# Patient Record
Sex: Male | Born: 1965 | Race: White | Hispanic: No | Marital: Single | Smoking: Current some day smoker
Health system: Southern US, Community
[De-identification: ages and names within clinical notes are randomized; demographics above are authoritative.]

## PROBLEM LIST (undated history)

## (undated) DIAGNOSIS — M654 Radial styloid tenosynovitis [de Quervain]: Secondary | ICD-10-CM

## (undated) DIAGNOSIS — I10 Essential (primary) hypertension: Secondary | ICD-10-CM

## (undated) DIAGNOSIS — K219 Gastro-esophageal reflux disease without esophagitis: Secondary | ICD-10-CM

## (undated) DIAGNOSIS — E1165 Type 2 diabetes mellitus with hyperglycemia: Secondary | ICD-10-CM

## (undated) DIAGNOSIS — S069X9A Unspecified intracranial injury with loss of consciousness of unspecified duration, initial encounter: Secondary | ICD-10-CM

## (undated) DIAGNOSIS — R05 Cough: Secondary | ICD-10-CM

## (undated) DIAGNOSIS — M5430 Sciatica, unspecified side: Secondary | ICD-10-CM

## (undated) DIAGNOSIS — F1910 Other psychoactive substance abuse, uncomplicated: Secondary | ICD-10-CM

## (undated) DIAGNOSIS — E1140 Type 2 diabetes mellitus with diabetic neuropathy, unspecified: Secondary | ICD-10-CM

## (undated) DIAGNOSIS — F1290 Cannabis use, unspecified, uncomplicated: Secondary | ICD-10-CM

## (undated) DIAGNOSIS — F1020 Alcohol dependence, uncomplicated: Secondary | ICD-10-CM

## (undated) DIAGNOSIS — E782 Mixed hyperlipidemia: Secondary | ICD-10-CM

## (undated) HISTORY — DX: Gastro-esophageal reflux disease without esophagitis: K21.9

## (undated) HISTORY — DX: Cannabis use, unspecified, uncomplicated: F12.90

## (undated) HISTORY — DX: Cough: R05

## (undated) HISTORY — DX: Type 2 diabetes mellitus with hyperglycemia: E11.65

## (undated) HISTORY — DX: Mixed hyperlipidemia: E78.2

## (undated) HISTORY — DX: Type 2 diabetes mellitus with diabetic neuropathy, unspecified: E11.40

## (undated) HISTORY — DX: Other psychoactive substance abuse, uncomplicated: F19.10

## (undated) HISTORY — DX: Alcohol dependence, uncomplicated: F10.20

## (undated) HISTORY — PX: OTHER SURGICAL HISTORY: SHX169

## (undated) HISTORY — DX: Radial styloid tenosynovitis (de quervain): M65.4

## (undated) HISTORY — DX: Sciatica, unspecified side: M54.30

## (undated) HISTORY — DX: Unspecified intracranial injury with loss of consciousness of unspecified duration, initial encounter: S06.9X9A

## (undated) HISTORY — DX: Essential (primary) hypertension: I10

---

## 2004-08-02 ENCOUNTER — Ambulatory Visit: Payer: Self-pay | Admitting: Family Medicine

## 2011-06-06 ENCOUNTER — Emergency Department: Payer: Self-pay | Admitting: Emergency Medicine

## 2012-06-18 DIAGNOSIS — E782 Mixed hyperlipidemia: Secondary | ICD-10-CM

## 2012-06-18 DIAGNOSIS — M5430 Sciatica, unspecified side: Secondary | ICD-10-CM

## 2012-06-18 DIAGNOSIS — I10 Essential (primary) hypertension: Secondary | ICD-10-CM

## 2012-06-18 DIAGNOSIS — F1910 Other psychoactive substance abuse, uncomplicated: Secondary | ICD-10-CM

## 2012-06-18 DIAGNOSIS — M654 Radial styloid tenosynovitis [de Quervain]: Secondary | ICD-10-CM

## 2012-06-18 DIAGNOSIS — E1140 Type 2 diabetes mellitus with diabetic neuropathy, unspecified: Secondary | ICD-10-CM | POA: Insufficient documentation

## 2012-06-18 DIAGNOSIS — K219 Gastro-esophageal reflux disease without esophagitis: Secondary | ICD-10-CM | POA: Insufficient documentation

## 2012-06-18 DIAGNOSIS — S069X9A Unspecified intracranial injury with loss of consciousness of unspecified duration, initial encounter: Secondary | ICD-10-CM

## 2012-06-18 DIAGNOSIS — F1020 Alcohol dependence, uncomplicated: Secondary | ICD-10-CM | POA: Insufficient documentation

## 2012-06-18 DIAGNOSIS — S069XAA Unspecified intracranial injury with loss of consciousness status unknown, initial encounter: Secondary | ICD-10-CM

## 2012-06-18 HISTORY — DX: Other psychoactive substance abuse, uncomplicated: F19.10

## 2012-06-18 HISTORY — DX: Mixed hyperlipidemia: E78.2

## 2012-06-18 HISTORY — DX: Radial styloid tenosynovitis (de quervain): M65.4

## 2012-06-18 HISTORY — DX: Unspecified intracranial injury with loss of consciousness status unknown, initial encounter: S06.9XAA

## 2012-06-18 HISTORY — DX: Unspecified intracranial injury with loss of consciousness of unspecified duration, initial encounter: S06.9X9A

## 2012-06-18 HISTORY — DX: Essential (primary) hypertension: I10

## 2012-06-18 HISTORY — DX: Sciatica, unspecified side: M54.30

## 2012-08-18 ENCOUNTER — Ambulatory Visit: Payer: Self-pay | Admitting: Neurology

## 2013-11-04 DIAGNOSIS — E1165 Type 2 diabetes mellitus with hyperglycemia: Secondary | ICD-10-CM

## 2013-11-04 DIAGNOSIS — IMO0002 Reserved for concepts with insufficient information to code with codable children: Secondary | ICD-10-CM

## 2013-11-04 DIAGNOSIS — E1140 Type 2 diabetes mellitus with diabetic neuropathy, unspecified: Secondary | ICD-10-CM

## 2013-11-04 HISTORY — DX: Reserved for concepts with insufficient information to code with codable children: IMO0002

## 2013-11-04 HISTORY — DX: Type 2 diabetes mellitus with diabetic neuropathy, unspecified: E11.40

## 2014-01-11 ENCOUNTER — Emergency Department: Payer: Self-pay | Admitting: Emergency Medicine

## 2014-01-11 LAB — CBC
HCT: 43.8 % (ref 40.0–52.0)
HGB: 14.6 g/dL (ref 13.0–18.0)
MCH: 29.4 pg (ref 26.0–34.0)
MCHC: 33.3 g/dL (ref 32.0–36.0)
MCV: 88 fL (ref 80–100)
Platelet: 245 10*3/uL (ref 150–440)
RBC: 4.96 10*6/uL (ref 4.40–5.90)
RDW: 12.7 % (ref 11.5–14.5)
WBC: 7.5 10*3/uL (ref 3.8–10.6)

## 2014-01-11 LAB — BASIC METABOLIC PANEL
Anion Gap: 8 (ref 7–16)
BUN: 14 mg/dL (ref 7–18)
CO2: 30 mmol/L (ref 21–32)
Calcium, Total: 9.3 mg/dL (ref 8.5–10.1)
Chloride: 97 mmol/L — ABNORMAL LOW (ref 98–107)
Creatinine: 0.99 mg/dL (ref 0.60–1.30)
EGFR (African American): >60
EGFR (Non-African Amer.): >60
GLUCOSE: 374 mg/dL — AB (ref 65–99)
Osmolality: 286 (ref 275–301)
POTASSIUM: 3.9 mmol/L (ref 3.5–5.1)
Sodium: 135 mmol/L — ABNORMAL LOW (ref 136–145)

## 2014-01-11 LAB — PROTIME-INR
INR: 0.8
PROTHROMBIN TIME: 11.4 s — AB (ref 11.5–14.7)

## 2014-01-11 LAB — TROPONIN I

## 2014-07-02 ENCOUNTER — Emergency Department: Admission: EM | Admit: 2014-07-02 | Discharge: 2014-07-02

## 2014-07-02 NOTE — ED Notes (Signed)
Forensic blood draw obtained per [employer]. Pt cursing and yelling during procedure. Specimens obtained from right hand, cleaned with betadine per protocol. Given to officer. Pt taken via [employer].

## 2015-05-18 DIAGNOSIS — E119 Type 2 diabetes mellitus without complications: Secondary | ICD-10-CM | POA: Insufficient documentation

## 2015-05-18 DIAGNOSIS — Z720 Tobacco use: Secondary | ICD-10-CM | POA: Insufficient documentation

## 2015-05-18 DIAGNOSIS — Z794 Long term (current) use of insulin: Secondary | ICD-10-CM

## 2015-05-18 DIAGNOSIS — F1020 Alcohol dependence, uncomplicated: Secondary | ICD-10-CM

## 2015-05-18 HISTORY — DX: Alcohol dependence, uncomplicated: F10.20

## 2016-03-04 DIAGNOSIS — F1290 Cannabis use, unspecified, uncomplicated: Secondary | ICD-10-CM | POA: Insufficient documentation

## 2016-03-15 DIAGNOSIS — K219 Gastro-esophageal reflux disease without esophagitis: Secondary | ICD-10-CM

## 2016-03-15 DIAGNOSIS — R053 Chronic cough: Secondary | ICD-10-CM

## 2016-03-15 DIAGNOSIS — R05 Cough: Secondary | ICD-10-CM | POA: Insufficient documentation

## 2016-03-15 DIAGNOSIS — F1290 Cannabis use, unspecified, uncomplicated: Secondary | ICD-10-CM

## 2016-03-15 HISTORY — DX: Gastro-esophageal reflux disease without esophagitis: K21.9

## 2016-03-15 HISTORY — DX: Cannabis use, unspecified, uncomplicated: F12.90

## 2016-03-15 HISTORY — DX: Chronic cough: R05.3

## 2016-03-18 ENCOUNTER — Other Ambulatory Visit: Payer: Self-pay | Admitting: Internal Medicine

## 2016-03-18 DIAGNOSIS — F1290 Cannabis use, unspecified, uncomplicated: Secondary | ICD-10-CM

## 2016-03-18 DIAGNOSIS — R05 Cough: Secondary | ICD-10-CM

## 2016-03-18 DIAGNOSIS — R059 Cough, unspecified: Secondary | ICD-10-CM

## 2016-03-26 ENCOUNTER — Ambulatory Visit
Admission: RE | Admit: 2016-03-26 | Discharge: 2016-03-26 | Disposition: A | Discharge status: Home / Self Care | Payer: BLUE CROSS/BLUE SHIELD | Source: Ambulatory Visit | Attending: Internal Medicine | Admitting: Internal Medicine

## 2016-03-26 DIAGNOSIS — R05 Cough: Secondary | ICD-10-CM | POA: Diagnosis present

## 2016-03-26 DIAGNOSIS — R059 Cough, unspecified: Secondary | ICD-10-CM

## 2016-03-26 DIAGNOSIS — R1901 Right upper quadrant abdominal swelling, mass and lump: Secondary | ICD-10-CM | POA: Diagnosis not present

## 2016-03-26 DIAGNOSIS — F1290 Cannabis use, unspecified, uncomplicated: Secondary | ICD-10-CM

## 2016-03-26 DIAGNOSIS — I2510 Atherosclerotic heart disease of native coronary artery without angina pectoris: Secondary | ICD-10-CM | POA: Insufficient documentation

## 2016-04-01 ENCOUNTER — Other Ambulatory Visit: Payer: Self-pay | Admitting: Internal Medicine

## 2016-04-01 DIAGNOSIS — IMO0002 Reserved for concepts with insufficient information to code with codable children: Secondary | ICD-10-CM

## 2016-04-04 ENCOUNTER — Ambulatory Visit
Admission: RE | Admit: 2016-04-04 | Discharge: 2016-04-04 | Disposition: A | Discharge status: Home / Self Care | Payer: BLUE CROSS/BLUE SHIELD | Source: Ambulatory Visit | Attending: Internal Medicine | Admitting: Internal Medicine

## 2016-04-04 DIAGNOSIS — K760 Fatty (change of) liver, not elsewhere classified: Secondary | ICD-10-CM | POA: Diagnosis not present

## 2016-04-04 DIAGNOSIS — IMO0002 Reserved for concepts with insufficient information to code with codable children: Secondary | ICD-10-CM

## 2016-04-04 DIAGNOSIS — K668 Other specified disorders of peritoneum: Secondary | ICD-10-CM | POA: Diagnosis present

## 2016-04-04 DIAGNOSIS — N281 Cyst of kidney, acquired: Secondary | ICD-10-CM | POA: Diagnosis not present

## 2016-04-17 ENCOUNTER — Ambulatory Visit: Payer: BLUE CROSS/BLUE SHIELD | Admitting: Urology

## 2016-04-17 ENCOUNTER — Encounter: Payer: Self-pay | Admitting: Urology

## 2016-04-17 VITALS — BP 161/90 | HR 74 | Ht 69.0 in | Wt 195.0 lb

## 2016-04-17 DIAGNOSIS — N281 Cyst of kidney, acquired: Secondary | ICD-10-CM

## 2016-04-17 NOTE — Progress Notes (Signed)
04/17/2016 3:56 PM   Frederick Koyanagiurtis Heath Mcaleer Jr. October 06, 1965 [id number]  Referring provider: Leotis ShamesJasmine Singh, MD [address] [facility] [place], [place] [id number]  Chief Complaint  Patient presents with  . New Patient (Initial Visit)    Renal Cyst    HPI: 51 year old male who presents today for further evaluation of a 6 centimeter right upper pole renal cyst. He underwent workup for chronic cough and right shoulder pain by his PCP which included a chest x-ray, CT scan at which time an incidental renal) was identified. He underwent further imaging in the form of CT abdomen without contrast which revealed a 6.2 cm right upper pole simple renal cyst.  He reports that he does have occasional back pain, both right and left but greater on the right. He attributed this to sciatica and musculoskeletal pain. This is not severe.  He does also report that over the past 6+ months, he has had a chronic cough and right shoulder pain. She underwent further workup including CT of the chest which showed no lung pathology. Etiology of the cough is unclear. He states that this happened soon [place]. His mother also developed the same chronic cough.  His past medical history is significant for alcohol dependence, drinks at least 6 beers daily and uses marijuana daily.   He denies any voiding issues. No gross hematuria or dysuria.  No previous renal imaging for comparison.  PMH: Past Medical History:  Diagnosis Date  . Alcohol dependence, daily use (HCC) 05/18/2015  . Chronic cough 03/15/2016  . De Quervain's tenosynovitis 06/18/2012  . Essential hypertension 06/18/2012  . GERD without esophagitis 03/15/2016  . Marijuana smoker, continuous (HCC) 03/15/2016  . Mixed hyperlipidemia 06/18/2012  . Polysubstance abuse 06/18/2012  . Sciatica 06/18/2012  . 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 Uncontrolled type 2 diabetes with neuropathy (HCC) 11/04/2013    Surgical History: Past Surgical History:  Procedure Laterality Date  . none      Home Medications:  Allergies as of 04/17/2016   No Known Allergies     Medication List       Accurate as of 04/17/16 11:59 PM. Always use your most recent med list.          fluticasone 110 MCG/ACT inhaler Commonly known as:  FLOVENT HFA Inhale into the lungs.   insulin regular 250 units/2.785mL (100 units/mL) injection Commonly known as:  NOVOLIN R,HUMULIN R Take 15 units tid ac   levocetirizine 5 MG tablet Commonly known as:  XYZAL Take by mouth.   lisinopril-hydrochlorothiazide 10-12.5 MG tablet Commonly known as:  PRINZIDE,ZESTORETIC Take by mouth.   montelukast 10 MG tablet Commonly known as:  SINGULAIR Take by mouth.       Allergies: No Known Allergies  Family History: Family History  Problem Relation Age of Onset  . Bladder Cancer Neg Hx   . Prostate cancer Neg Hx     Social History:  reports that he has never smoked. His smokeless tobacco use includes Snuff. He reports that he drinks alcohol. He reports that he uses drugs, including Marijuana.  ROS: UROLOGY Frequent Urination?: Yes Hard to postpone urination?: No Burning/pain with urination?: No Get up at night to urinate?: Yes Leakage of urine?: No Urine stream starts and stops?: No Trouble starting stream?: No Do you have to strain to urinate?: No Blood  in urine?: No Urinary tract infection?: No Sexually transmitted disease?: Yes Injury to kidneys or bladder?: Yes Painful intercourse?: No Weak stream?: No Erection problems?: No Penile pain?: No  Gastrointestinal Nausea?: No Vomiting?: No Indigestion/heartburn?: No Diarrhea?: No Constipation?: No  Constitutional Fever: No Night sweats?: No Weight loss?: No Fatigue?: No  Skin Skin rash/lesions?: No Itching?: No  Eyes Blurred vision?: No Double vision?:  No  Ears/Nose/Throat Sore throat?: No Sinus problems?: Yes  Hematologic/Lymphatic Swollen glands?: No Easy bruising?: No  Cardiovascular Leg swelling?: No Chest pain?: No  Respiratory Cough?: Yes Shortness of breath?: No  Endocrine Excessive thirst?: No  Musculoskeletal Back pain?: No Joint pain?: No  Neurological Headaches?: No Dizziness?: No  Psychologic Depression?: No Anxiety?: No  Physical Exam: BP (!) 161/90   Pulse 74   Ht 5\' 9"  (1.753 m)   Wt 195 lb (88.5 kg)   BMI 28.80 kg/m   Constitutional:  Alert and oriented, No acute distress. HEENT: [place] AT, moist mucus membranes.  Trachea midline, no masses. Cardiovascular: No clubbing, cyanosis, or edema. Respiratory: Normal respiratory effort, no increased work of breathing. GI: Abdomen is soft, nontender, nondistended, no abdominal masses GU: No CVA tenderness.  Skin: No rashes, bruises or suspicious lesions. Neurologic: Grossly intact, no focal deficits, moving all 4 extremities. Psychiatric: Normal mood and affect.  Laboratory Data: Cr 0.8 on 06/15/16 A1C 11.5 on 06/16/15  Urinalysis n/a  Pertinent Imaging: CLINICAL DATA:  Abdominal cyst.  EXAM: CT ABDOMEN WITHOUT CONTRAST  TECHNIQUE: Multidetector CT imaging of the abdomen was performed following the standard protocol without IV contrast.  COMPARISON:  None.  FINDINGS: Lower chest: No acute abnormality.  Hepatobiliary: No gallstones are noted. Fatty infiltration of the liver is noted.  Pancreas: Unremarkable. No pancreatic ductal dilatation or surrounding inflammatory changes.  Spleen: Normal in size without focal abnormality.  Adrenals/Urinary Tract: Adrenal glands appear normal. No hydronephrosis or renal obstruction is noted. 6.2 cm simple cyst is seen arising from upper pole of right kidney.  Stomach/Bowel: Stomach is within normal limits. Appendix appears normal. No evidence of bowel wall thickening, distention,  or inflammatory changes.  Vascular/Lymphatic: No significant vascular findings are present. No enlarged abdominal or pelvic lymph nodes.  Other: No abdominal wall hernia or abnormality.  Musculoskeletal: No acute or significant osseous findings.  IMPRESSION: Fatty infiltration of the liver.  6.2 cm simple right renal cyst.   Electronically Signed   By: Lupita Raider, M.D.   On: 04/04/2016 14:34  ET imaging was reviewed today personally and with the patient.   Assessment & Plan:     1. Renal cyst, right Large 6.2 cm right upper pole renal cyst, appears to be simple but CT w/o contrast RUS ordered to ensure bosniak I We discussed that in general, I would expect that this cyst has been there for quite some time and these tend to be relatively asymptomatic He does however have an unusual cough of unclear etiology as well as right shoulder pain --> discussion today although unlikely, if not impossible that the cyst could be causing upward pressure on the liver, diaphragmatic irritation and referred shoulder pain If he continues to have cough, he was offered percutanteous aspiration of the renal for diagnostic purposes If his pain and cough improve with this, would possibly consider laparoscopic versus robotic cyst decortication - Ultrasound renal complete; Future  Return for will call with results.  Vanna Scotland, MD  [facility] [address], [address] [place], [place] [address] [phone]

## 2016-04-23 ENCOUNTER — Other Ambulatory Visit: Payer: Self-pay | Admitting: Urology

## 2016-04-23 ENCOUNTER — Ambulatory Visit
Admission: RE | Admit: 2016-04-23 | Discharge: 2016-04-23 | Disposition: A | Discharge status: Home / Self Care | Payer: BLUE CROSS/BLUE SHIELD | Source: Ambulatory Visit | Attending: Urology | Admitting: Urology

## 2016-04-23 DIAGNOSIS — K769 Liver disease, unspecified: Secondary | ICD-10-CM | POA: Insufficient documentation

## 2016-04-23 DIAGNOSIS — N281 Cyst of kidney, acquired: Secondary | ICD-10-CM

## 2016-04-23 DIAGNOSIS — N200 Calculus of kidney: Secondary | ICD-10-CM | POA: Diagnosis not present

## 2017-01-15 ENCOUNTER — Encounter: Payer: Self-pay | Admitting: Anesthesiology

## 2017-01-15 ENCOUNTER — Encounter: Admission: RE | Payer: Self-pay | Source: Ambulatory Visit

## 2017-01-15 SURGERY — ESOPHAGOGASTRODUODENOSCOPY (EGD) WITH PROPOFOL
Anesthesia: General

## 2017-01-20 ENCOUNTER — Ambulatory Visit
Admission: RE | Admit: 2017-01-20 | Payer: BLUE CROSS/BLUE SHIELD | Source: Ambulatory Visit | Admitting: Internal Medicine

## 2017-02-12 ENCOUNTER — Emergency Department: Payer: BLUE CROSS/BLUE SHIELD

## 2017-02-12 ENCOUNTER — Emergency Department
Admission: EM | Admit: 2017-02-12 | Discharge: 2017-02-12 | Discharge status: Against Medical Advice | Payer: BLUE CROSS/BLUE SHIELD | Attending: Emergency Medicine | Admitting: Emergency Medicine

## 2017-02-12 ENCOUNTER — Other Ambulatory Visit: Payer: Self-pay

## 2017-02-12 ENCOUNTER — Encounter: Payer: Self-pay | Admitting: Emergency Medicine

## 2017-02-12 DIAGNOSIS — R202 Paresthesia of skin: Secondary | ICD-10-CM

## 2017-02-12 DIAGNOSIS — F17200 Nicotine dependence, unspecified, uncomplicated: Secondary | ICD-10-CM | POA: Diagnosis not present

## 2017-02-12 DIAGNOSIS — E871 Hypo-osmolality and hyponatremia: Secondary | ICD-10-CM | POA: Diagnosis not present

## 2017-02-12 DIAGNOSIS — R200 Anesthesia of skin: Secondary | ICD-10-CM | POA: Diagnosis present

## 2017-02-12 DIAGNOSIS — Z87820 Personal history of traumatic brain injury: Secondary | ICD-10-CM | POA: Diagnosis not present

## 2017-02-12 DIAGNOSIS — R109 Unspecified abdominal pain: Secondary | ICD-10-CM | POA: Insufficient documentation

## 2017-02-12 DIAGNOSIS — E1165 Type 2 diabetes mellitus with hyperglycemia: Secondary | ICD-10-CM | POA: Insufficient documentation

## 2017-02-12 DIAGNOSIS — R739 Hyperglycemia, unspecified: Secondary | ICD-10-CM

## 2017-02-12 DIAGNOSIS — I10 Essential (primary) hypertension: Secondary | ICD-10-CM | POA: Diagnosis not present

## 2017-02-12 LAB — CBC WITH DIFFERENTIAL/PLATELET
BASOS PCT: 1 %
Basophils Absolute: 0.1 10*3/uL (ref 0–0.1)
EOS ABS: 0.3 10*3/uL (ref 0–0.7)
EOS PCT: 3 %
HCT: 44.4 % (ref 40.0–52.0)
HEMOGLOBIN: 15.3 g/dL (ref 13.0–18.0)
Lymphocytes Relative: 20 %
Lymphs Abs: 1.9 10*3/uL (ref 1.0–3.6)
MCH: 31.4 pg (ref 26.0–34.0)
MCHC: 34.5 g/dL (ref 32.0–36.0)
MCV: 90.9 fL (ref 80.0–100.0)
MONOS PCT: 8 %
Monocytes Absolute: 0.7 10*3/uL (ref 0.2–1.0)
NEUTROS PCT: 68 %
Neutro Abs: 6.4 10*3/uL (ref 1.4–6.5)
PLATELETS: 230 10*3/uL (ref 150–440)
RBC: 4.88 MIL/uL (ref 4.40–5.90)
RDW: 12.9 % (ref 11.5–14.5)
WBC: 9.3 10*3/uL (ref 3.8–10.6)

## 2017-02-12 LAB — URINE DRUG SCREEN, QUALITATIVE (ARMC ONLY)
Amphetamines, Ur Screen: NOT DETECTED
BARBITURATES, UR SCREEN: NOT DETECTED
BENZODIAZEPINE, UR SCRN: NOT DETECTED
CANNABINOID 50 NG, UR ~~LOC~~: NOT DETECTED
Cocaine Metabolite,Ur ~~LOC~~: NOT DETECTED
MDMA (ECSTASY) UR SCREEN: NOT DETECTED
Methadone Scn, Ur: NOT DETECTED
Opiate, Ur Screen: NOT DETECTED
Phencyclidine (PCP) Ur S: NOT DETECTED
TRICYCLIC, UR SCREEN: NOT DETECTED

## 2017-02-12 LAB — COMPREHENSIVE METABOLIC PANEL
ALBUMIN: 4.7 g/dL (ref 3.5–5.0)
ALK PHOS: 75 U/L (ref 38–126)
ALT: 54 U/L (ref 17–63)
ANION GAP: 16 — AB (ref 5–15)
AST: 55 U/L — ABNORMAL HIGH (ref 15–41)
BUN: 17 mg/dL (ref 6–20)
CALCIUM: 9.5 mg/dL (ref 8.9–10.3)
CHLORIDE: 94 mmol/L — AB (ref 101–111)
CO2: 21 mmol/L — AB (ref 22–32)
Creatinine, Ser: 0.86 mg/dL (ref 0.61–1.24)
GFR calc non Af Amer: >60 mL/min (ref >60)
GLUCOSE: 301 mg/dL — AB (ref 65–99)
Potassium: 3.6 mmol/L (ref 3.5–5.1)
SODIUM: 131 mmol/L — AB (ref 135–145)
Total Bilirubin: 0.8 mg/dL (ref 0.3–1.2)
Total Protein: 8.5 g/dL — ABNORMAL HIGH (ref 6.5–8.1)

## 2017-02-12 LAB — URINALYSIS, COMPLETE (UACMP) WITH MICROSCOPIC
BACTERIA UA: NONE SEEN
BILIRUBIN URINE: NEGATIVE
Glucose, UA: >=500 mg/dL — AB
HGB URINE DIPSTICK: NEGATIVE
KETONES UR: NEGATIVE mg/dL
LEUKOCYTES UA: NEGATIVE
NITRITE: NEGATIVE
PROTEIN: NEGATIVE mg/dL
RBC / HPF: NONE SEEN RBC/hpf (ref 0–5)
SPECIFIC GRAVITY, URINE: 1.005 (ref 1.005–1.030)
Squamous Epithelial / LPF: NONE SEEN
WBC UA: NONE SEEN WBC/hpf (ref 0–5)
pH: 5 (ref 5.0–8.0)

## 2017-02-12 LAB — TROPONIN I: Troponin I: <0.03 ng/mL (ref <0.03)

## 2017-02-12 LAB — ETHANOL: ALCOHOL ETHYL (B): 50 mg/dL — AB (ref <10)

## 2017-02-12 NOTE — ED Notes (Signed)
Pt came to nurses station requesting IV be taken out and states that he has to leave because he has to be in court by 1000. IV removed per pt request. Pt asked for his test results and was informed that he would need to stay to see the physician to receive his test results. Pt stated he would come back after court for results. This RN informed pt that he would have to check back in to be seen again if he returned. Pt stated he had to leave. Pt walked out of room. Dr. Mayford KnifeWilliams informed.

## 2017-02-12 NOTE — ED Triage Notes (Signed)
Pt to ed with c/o numbness on right side x 2 weeks. Also reports pain in right abd area and headache on right side of head.

## 2017-02-12 NOTE — ED Provider Notes (Signed)
[facility] Emergency Department Provider Note       Time seen: ----------------------------------------- 8:17 AM on 02/12/2017 -----------------------------------------   I have reviewed the triage vital signs and the nursing notes.  HISTORY   Chief Complaint Numbness    HPI Frederick KoyanagiCurtis Heath Seide Jr. is a 52 y.o. male with a history of alcohol abuse, hypertension, polysubstance abuse, traumatic brain injury who presents to the ED for numbness on his right side for the past 2 weeks.  Patient also reports pain in the right side of the abdominal area and headache on the right side of his head.  Pain is 5 out of 10, nothing makes it better or worse.  She also notes he has been dealing with the symptoms for 2 weeks and has not sought medical care because his father is currently dying and on hospice care.  He thinks some of his symptoms may be stress related.  Past Medical History:  Diagnosis Date  . Alcohol dependence, daily use (HCC) 05/18/2015  . Chronic cough 03/15/2016  . De Quervain's tenosynovitis 06/18/2012  . Essential hypertension 06/18/2012  . GERD without esophagitis 03/15/2016  . Marijuana smoker, continuous 03/15/2016  . Mixed hyperlipidemia 06/18/2012  . Polysubstance abuse (HCC) 06/18/2012  . Sciatica 06/18/2012  . Traumatic brain injury [facility](HCC) 06/18/2012   Overview:  Has been in hospital with LOC from baseball, football, basketball in his 20s  . Uncontrolled type 2 diabetes with neuropathy (HCC) 11/04/2013    Patient Active Problem List   Diagnosis Date Noted  . Marijuana smoker, continuous 03/15/2016  . GERD without esophagitis 03/15/2016  . Chronic cough 03/15/2016  . Marijuana smoker 03/04/2016  . Type 2 diabetes mellitus without complication, with long-term current use of insulin (HCC) 05/18/2015  . Chewing tobacco use 05/18/2015  . Alcohol dependence, daily use (HCC) 05/18/2015  . Uncontrolled type 2 diabetes with neuropathy (HCC) 11/04/2013  .  Traumatic brain injury (HCC) 06/18/2012  . Sciatica 06/18/2012  . Polysubstance abuse (HCC) 06/18/2012  . Mixed hyperlipidemia 06/18/2012  . GERD (gastroesophageal reflux disease) 06/18/2012  . Essential hypertension 06/18/2012  . Diabetic neuropathy (HCC) 06/18/2012  . De Quervain's tenosynovitis 06/18/2012  . Alcoholism (HCC) 06/18/2012    Past Surgical History:  Procedure Laterality Date  . none      Allergies Patient has no known allergies.  Social History Social History   Tobacco Use  . Smoking status: Current Some Day Smoker  . Smokeless tobacco: Current User    Types: Snuff  Substance Use Topics  . Alcohol use: Yes    Comment: 6 pack a day  . Drug use: Yes    Types: Marijuana    Review of Systems Constitutional: Negative for fever. Cardiovascular: Negative for chest pain. Respiratory: Negative for shortness of breath. Gastrointestinal: Positive for abdominal pain Genitourinary: Negative for dysuria. Musculoskeletal: Negative for back pain.   Skin: Negative for rash. Neurological: Positive for headache, numbness  All systems negative/normal/unremarkable except as stated in the HPI  ____________________________________________   PHYSICAL EXAM:  VITAL SIGNS: ED Triage Vitals  Enc Vitals Group     BP 02/12/17 0806 (!) 163/89     Pulse Rate 02/12/17 0806 83     Resp 02/12/17 0806 12     Temp 02/12/17 0806 98.4 F (36.9 C)     Temp Source 02/12/17 0806 Oral     SpO2 02/12/17 0806 99 %     Weight 02/12/17 0809 200 lb (90.7 kg)     Height 02/12/17  0809 5\' 9"  (1.753 m)     Head Circumference --      Peak Flow --      Pain Score 02/12/17 0811 5     Pain Loc --      Pain Edu? --      Excl. in GC? --     Constitutional: Alert and oriented. Well appearing and in no distress. Eyes: Conjunctivae are normal. Normal extraocular movements. ENT   Head: Normocephalic and atraumatic.   Nose: No congestion/rhinnorhea.   Mouth/Throat: Mucous  membranes are moist.   Neck: No stridor. Cardiovascular: Normal rate, regular rhythm. No murmurs, rubs, or gallops. Respiratory: Normal respiratory effort without tachypnea nor retractions. Breath sounds are clear and equal bilaterally. No wheezes/rales/rhonchi. Gastrointestinal: Soft and nontender. Normal bowel sounds Musculoskeletal: Nontender with normal range of motion in extremities. No lower extremity tenderness nor edema. Neurologic:  Normal speech and language. No gross focal neurologic deficits are appreciated.  Drink, sensation, cranial nerves appear to be normal Skin:  Skin is warm, dry and intact. No rash noted. Psychiatric: Elevated mood ___________________________________________  ED COURSE:  As part of my medical decision making, I reviewed the following data within the electronic MEDICAL RECORD NUMBER History obtained from family if available, nursing notes, old chart and ekg, as well as notes from prior ED visits. Patient presented for multiple complaints, we will assess with labs and imaging as indicated at this time.   Procedures ____________________________________________   LABS (pertinent positives/negatives)  Labs Reviewed  COMPREHENSIVE METABOLIC PANEL - Abnormal; Notable for the following components:      Result Value   Sodium 131 (*)    Chloride 94 (*)    CO2 21 (*)    Glucose, Bld 301 (*)    Total Protein 8.5 (*)    AST 55 (*)    Anion gap 16 (*)    All other components within normal limits  URINALYSIS, COMPLETE (UACMP) WITH MICROSCOPIC - Abnormal; Notable for the following components:   Color, Urine COLORLESS (*)    APPearance CLEAR (*)    Glucose, UA >=500 (*)    All other components within normal limits  ETHANOL - Abnormal; Notable for the following components:   Alcohol, Ethyl (B) 50 (*)    All other components within normal limits  CBC WITH DIFFERENTIAL/PLATELET  TROPONIN I  URINE DRUG SCREEN, QUALITATIVE ([facility] ONLY)  CBG MONITORING, ED     RADIOLOGY  CT head Does not reveal any acute process ____________________________________________  DIFFERENTIAL DIAGNOSIS   Alcohol abuse, substance abuse, CVA, TIA, stress  FINAL ASSESSMENT AND PLAN  Headache, paresthesias, hyperglycemia   Plan: Patient had presented for 2 weeks of numbness, abdominal pain and headache. Patient's labs does reveal some hyponatremia, recent alcohol intake and hyperglycemia. Patient's imaging was reassuring.  Patient abruptly left AGAINST MEDICAL ADVICE because he stated he had to be in court   Emily Filbert, MD   Note: This note was generated in part or whole with voice recognition software. Voice recognition is usually quite accurate but there are transcription errors that can and very often do occur. I apologize for any typographical errors that were not detected and corrected.     Emily Filbert, MD 02/12/17 [phone]

## 2017-02-12 NOTE — ED Notes (Signed)
Pt left AMA without signing e-signature.

## 2018-08-07 IMAGING — CT CT HEAD W/O CM
3 series · 16 of 47 positions shown, 19 images · non-contrast
Comparison: CT head report dated June 19, 2003.

CLINICAL DATA: Right-sided numbness for the past 2 weeks.

EXAM:
CT HEAD WITHOUT CONTRAST
TECHNIQUE: Contiguous axial images were obtained from the base of the skull
through the vertex without intravenous contrast.

[Series 3: head wo · axial · 0.43mm/px · z∈[-242,-117]mm · 10 of 31 slices shown, 13 images]
[im 3/31  brain]
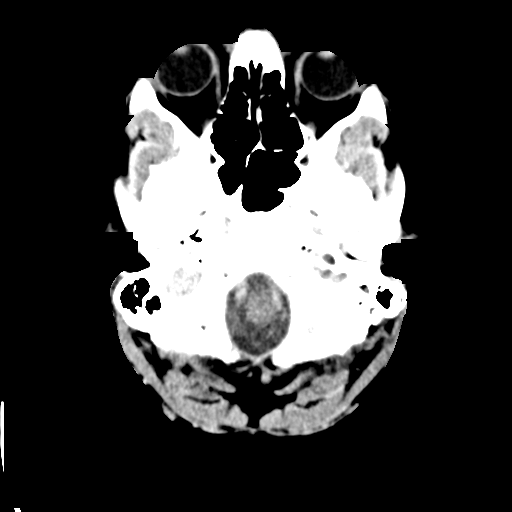
[im 3/31  bone]
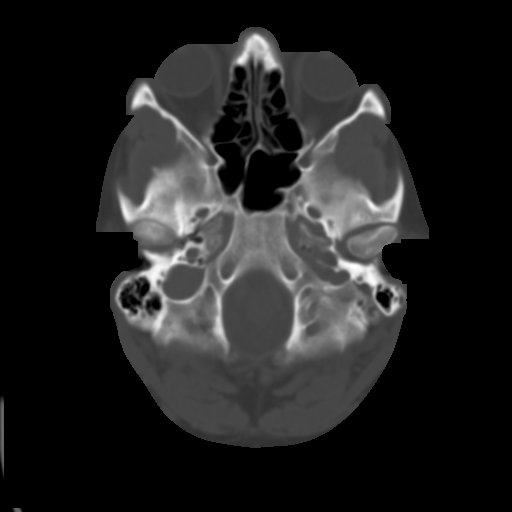
[im 6/31  brain]
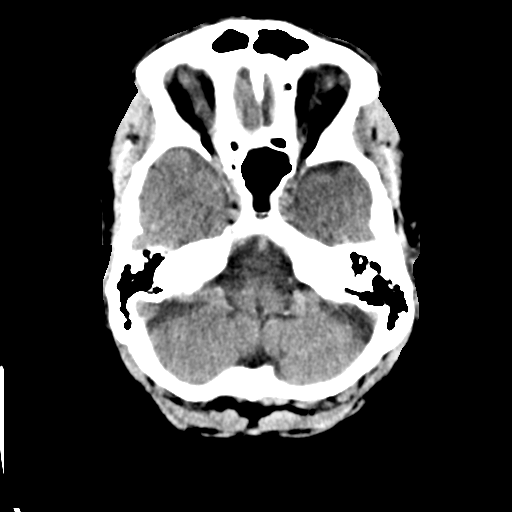
[im 9/31  brain]
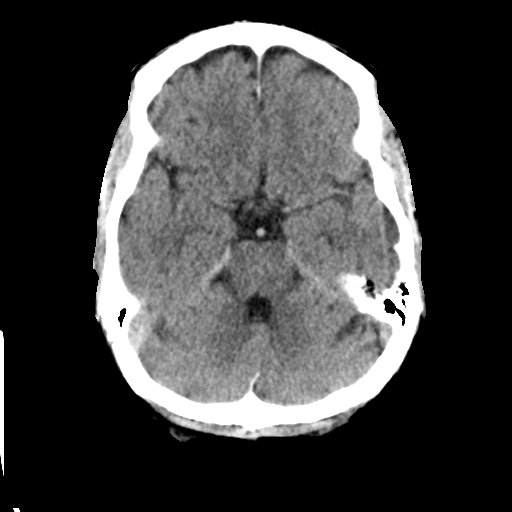
[im 11/31  brain]
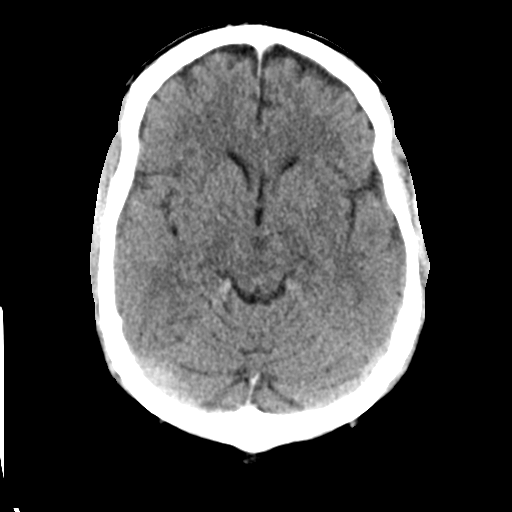
[im 14/31  brain]
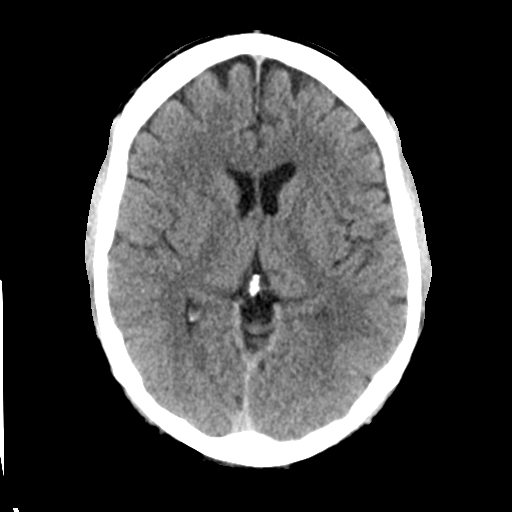
[im 14/31  bone]
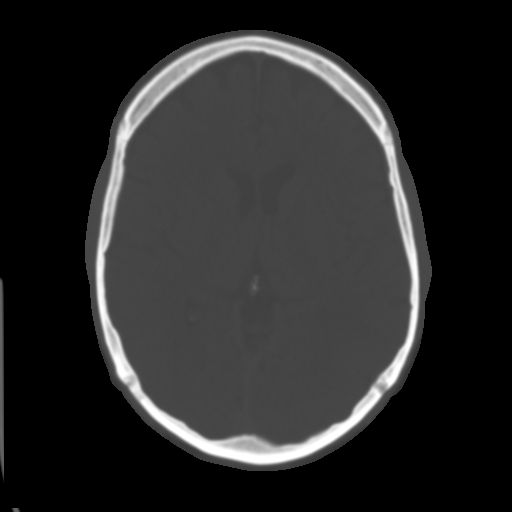
[im 17/31  brain]
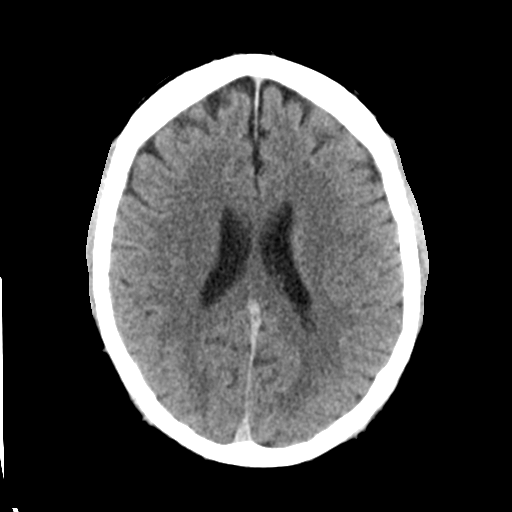
[im 20/31  brain]
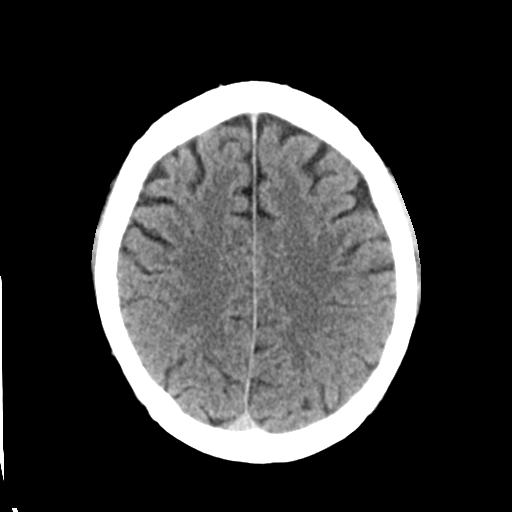
[im 23/31  brain]
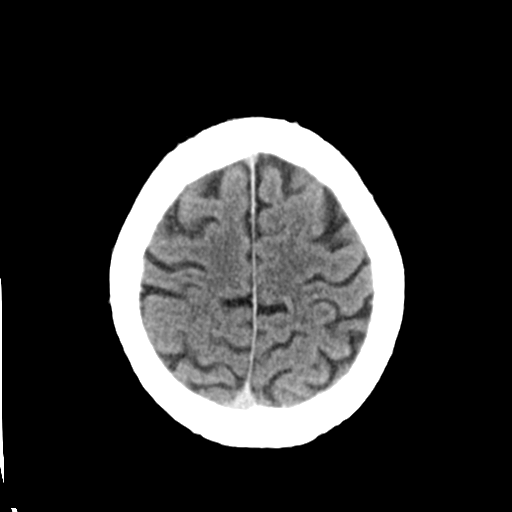
[im 25/31  brain]
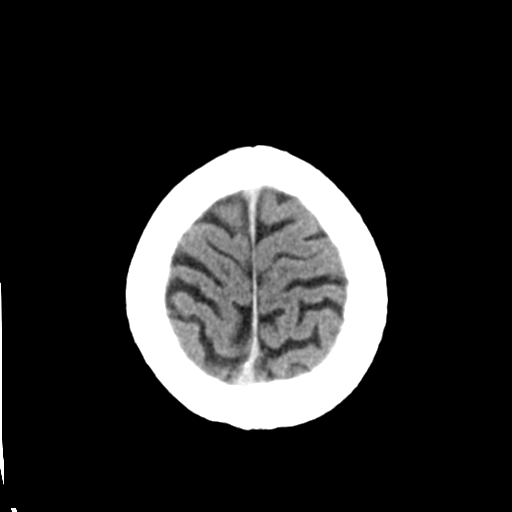
[im 25/31  bone]
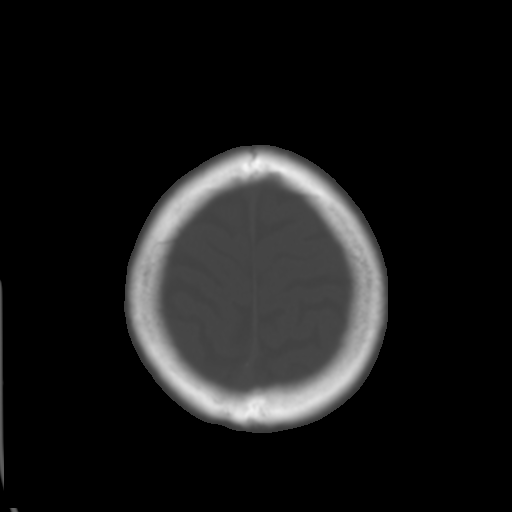
[im 28/31  brain]
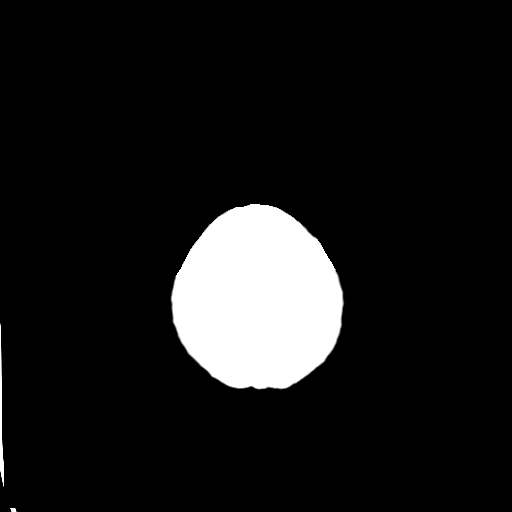

[Series 4: coronal soft tissue · coronal · 0.33mm/px · 3 of 67 slices shown]
[im 23/67  brain]
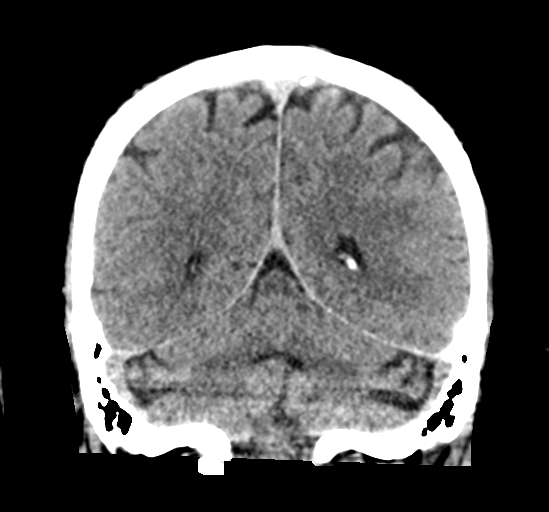
[im 30/67  brain]
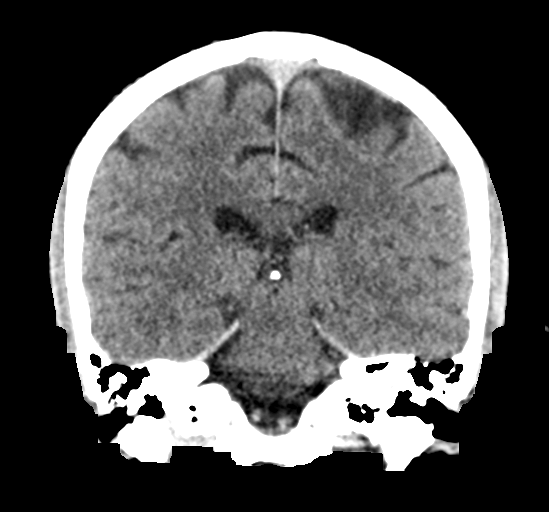
[im 37/67  brain]
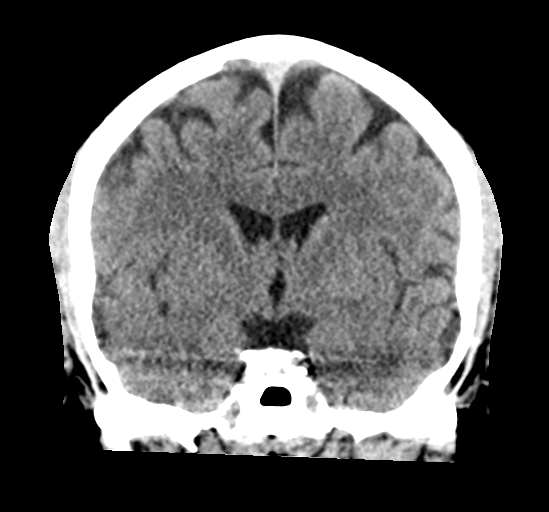

[Series 5: sagittal soft tissue · sagittal · 0.33mm/px · 3 of 55 slices shown]
[im 19/55  brain]
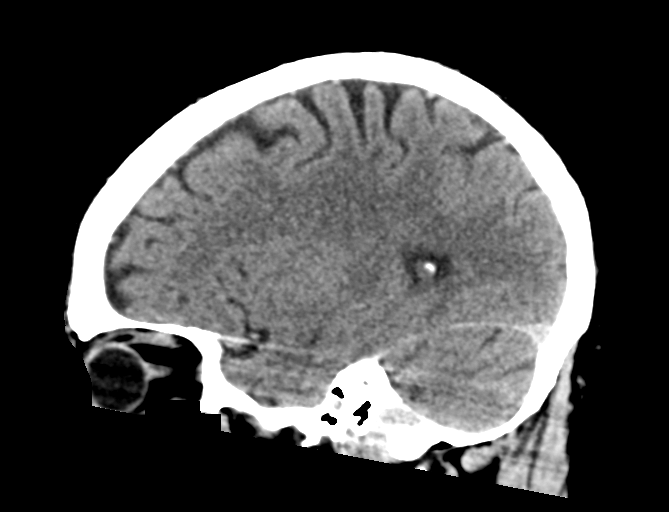
[im 28/55  brain]
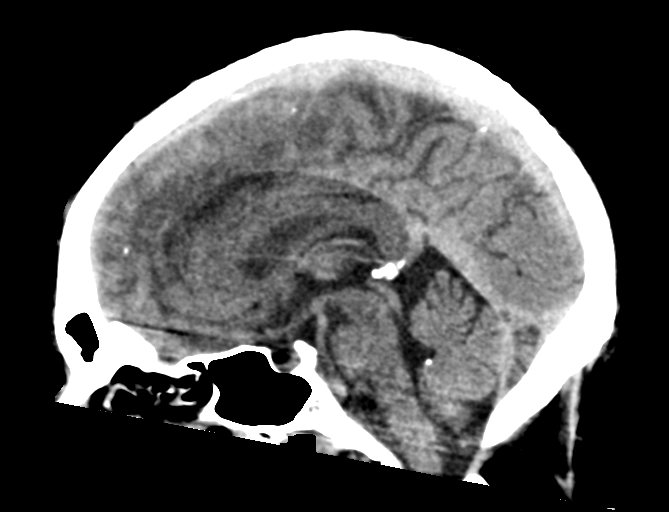
[im 37/55  brain]
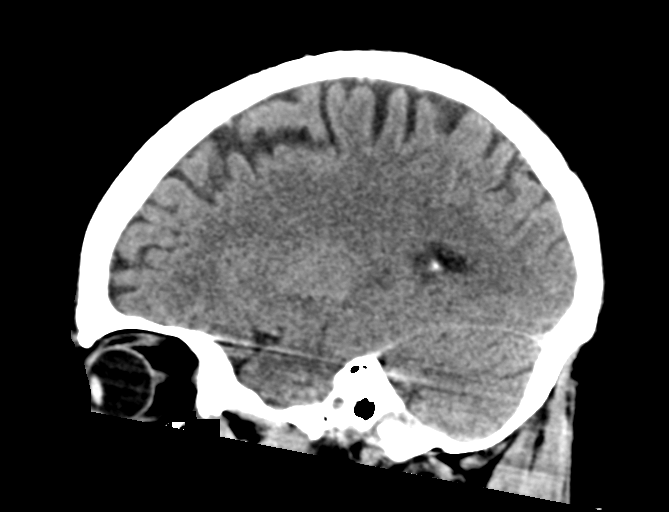

[16 of 47 positions shown; findings below may reference images not displayed]

FINDINGS: Brain: No evidence of acute infarction, hemorrhage, hydrocephalus,
extra-axial collection or mass lesion/mass effect.

Vascular: No hyperdense vessel or unexpected calcification.

Skull: Normal. Negative for fracture or focal lesion.

Sinuses/Orbits: No acute finding.

Other: None.
IMPRESSION: 1. Normal noncontrast head CT.

## 2020-11-02 DIAGNOSIS — Z794 Long term (current) use of insulin: Secondary | ICD-10-CM | POA: Insufficient documentation

## 2023-12-06 HISTORY — PX: CORONARY ANGIOPLASTY WITH STENT PLACEMENT: SHX49

## 2023-12-31 DIAGNOSIS — I209 Angina pectoris, unspecified: Secondary | ICD-10-CM | POA: Insufficient documentation

## 2024-02-09 ENCOUNTER — Encounter: Payer: Self-pay | Admitting: Emergency Medicine

## 2024-02-09 ENCOUNTER — Encounter: Attending: Cardiology | Admitting: Emergency Medicine

## 2024-02-09 ENCOUNTER — Other Ambulatory Visit: Payer: Self-pay

## 2024-02-09 DIAGNOSIS — Z955 Presence of coronary angioplasty implant and graft: Secondary | ICD-10-CM | POA: Insufficient documentation

## 2024-02-09 DIAGNOSIS — Z48812 Encounter for surgical aftercare following surgery on the circulatory system: Secondary | ICD-10-CM | POA: Insufficient documentation

## 2024-02-09 NOTE — Progress Notes (Signed)
 Initial phone call completed. Diagnosis can be found in [facility] 01/08/2025. EP Orientation scheduled for Wednesday 11/14 at 0800.

## 2024-02-18 ENCOUNTER — Encounter

## 2024-02-18 VITALS — Ht 68.5 in | Wt 217.4 lb

## 2024-02-18 DIAGNOSIS — Z48812 Encounter for surgical aftercare following surgery on the circulatory system: Secondary | ICD-10-CM | POA: Diagnosis present

## 2024-02-18 DIAGNOSIS — Z955 Presence of coronary angioplasty implant and graft: Secondary | ICD-10-CM | POA: Diagnosis not present

## 2024-02-18 NOTE — Patient Instructions (Signed)
 Patient Instructions  Patient Details  Name: Frederick Cole. MRN: [id number] Date of Birth: September 21, 1965 Referring Provider:  Jama Margery ORN, MD  Below are your personal goals for exercise, nutrition, and risk factors. Our goal is to help you stay on track towards obtaining and maintaining these goals. We will be discussing your progress on these goals with you throughout the program.  Initial Exercise Prescription:  Initial Exercise Prescription - 02/18/24 1100       Date of Initial Exercise RX and Referring Provider   Date 02/18/24    Referring Provider Dr. Margery Jama, MD      Oxygen   Maintain Oxygen Saturation 88% or higher      Treadmill   MPH 2.5    Grade 0    Minutes 15    METs 2.91      NuStep   Level 3    SPM 80    Minutes 15    METs 3.48      REL-XR   Level 3    Speed 50    Minutes 15    METs 3.48      Prescription Details   Frequency (times per week) 2    Duration Progress to 30 minutes of continuous aerobic without signs/symptoms of physical distress      Intensity   THRR 40-80% of Max Heartrate 109-144    Ratings of Perceived Exertion 11-13    Perceived Dyspnea 0-4      Progression   Progression Continue to progress workloads to maintain intensity without signs/symptoms of physical distress.      Resistance Training   Training Prescription Yes    Weight 5 lb    Reps 10-15          Exercise Goals: Frequency: Be able to perform aerobic exercise two to three times per week in program working toward 2-5 days per week of home exercise.  Intensity: Work with a perceived exertion of 11 (fairly light) - 15 (hard) while following your exercise prescription.  We will make changes to your prescription with you as you progress through the program.   Duration: Be able to do 30 to 45 minutes of continuous aerobic exercise in addition to a 5 minute warm-up and a 5 minute cool-down routine.   Nutrition Goals: Your personal nutrition goals will be  established when you do your nutrition analysis with the dietician.  The following are general nutrition guidelines to follow: Cholesterol < 200mg /day Sodium < 1500mg /day Fiber: Men over 50 yrs - 30 grams per day  Personal Goals:  Personal Goals and Risk Factors at Admission - 02/09/24 0935       Core Components/Risk Factors/Patient Goals on Admission    Weight Management Yes;Obesity;Weight Loss    Intervention Weight Management: Develop a combined nutrition and exercise program designed to reach desired caloric intake, while maintaining appropriate intake of nutrient and fiber, sodium and fats, and appropriate energy expenditure required for the weight goal.;Weight Management: Provide education and appropriate resources to help participant work on and attain dietary goals.;Weight Management/Obesity: Establish reasonable short term and long term weight goals.;Obesity: Provide education and appropriate resources to help participant work on and attain dietary goals.    Goal Weight: Long Term 190 lb (86.2 kg)    Expected Outcomes Short Term: Continue to assess and modify interventions until short term weight is achieved;Long Term: Adherence to nutrition and physical activity/exercise program aimed toward attainment of established weight goal;Weight Loss: Understanding of general recommendations for  a balanced deficit meal plan, which promotes 1-2 lb weight loss per week and includes a negative energy balance of [id number] kcal/d;Understanding recommendations for meals to include 15-35% energy as protein, 25-35% energy from fat, 35-60% energy from carbohydrates, less than 200mg  of dietary cholesterol, 20-35 gm of total fiber daily;Understanding of distribution of calorie intake throughout the day with the consumption of 4-5 meals/snacks    Diabetes Yes    Intervention Provide education about signs/symptoms and action to take for hypo/hyperglycemia.;Provide education about proper nutrition, including  hydration, and aerobic/resistive exercise prescription along with prescribed medications to achieve blood glucose in normal ranges: Fasting glucose 65-99 mg/dL    Expected Outcomes Short Term: Participant verbalizes understanding of the signs/symptoms and immediate care of hyper/hypoglycemia, proper foot care and importance of medication, aerobic/resistive exercise and nutrition plan for blood glucose control.;Long Term: Attainment of HbA1C < 7%.    Hypertension Yes    Intervention Provide education on lifestyle modifcations including regular physical activity/exercise, weight management, moderate sodium restriction and increased consumption of fresh fruit, vegetables, and low fat dairy, alcohol moderation, and smoking cessation.;Monitor prescription use compliance.    Expected Outcomes Short Term: Continued assessment and intervention until BP is < 140/54mm HG in hypertensive participants. < 130/62mm HG in hypertensive participants with diabetes, heart failure or chronic kidney disease.;Long Term: Maintenance of blood pressure at goal levels.    Lipids Yes    Intervention Provide education and support for participant on nutrition & aerobic/resistive exercise along with prescribed medications to achieve LDL 70mg , HDL >40mg .    Expected Outcomes Short Term: Participant states understanding of desired cholesterol values and is compliant with medications prescribed. Participant is following exercise prescription and nutrition guidelines.;Long Term: Cholesterol controlled with medications as prescribed, with individualized exercise RX and with personalized nutrition plan. Value goals: LDL < 70mg , HDL > 40 mg.         Exercise Goals and Review:  Exercise Goals     Row Name 02/18/24 1118             Exercise Goals   Increase Physical Activity Yes       Intervention Provide advice, education, support and counseling about physical activity/exercise needs.;Develop an individualized exercise  prescription for aerobic and resistive training based on initial evaluation findings, risk stratification, comorbidities and participant's personal goals.       Expected Outcomes Short Term: Attend rehab on a regular basis to increase amount of physical activity.;Long Term: Add in home exercise to make exercise part of routine and to increase amount of physical activity.;Long Term: Exercising regularly at least 3-5 days a week.       Increase Strength and Stamina Yes       Intervention Provide advice, education, support and counseling about physical activity/exercise needs.;Develop an individualized exercise prescription for aerobic and resistive training based on initial evaluation findings, risk stratification, comorbidities and participant's personal goals.       Expected Outcomes Short Term: Increase workloads from initial exercise prescription for resistance, speed, and METs.;Short Term: Perform resistance training exercises routinely during rehab and add in resistance training at home;Long Term: Improve cardiorespiratory fitness, muscular endurance and strength as measured by increased METs and functional capacity ( )       Able to understand and use rate of perceived exertion (RPE) scale Yes       Intervention Provide education and explanation on how to use RPE scale       Expected Outcomes Short Term: Able to use RPE daily  in rehab to express subjective intensity level;Long Term:  Able to use RPE to guide intensity level when exercising independently       Able to understand and use Dyspnea scale Yes       Intervention Provide education and explanation on how to use Dyspnea scale       Expected Outcomes Short Term: Able to use Dyspnea scale daily in rehab to express subjective sense of shortness of breath during exertion;Long Term: Able to use Dyspnea scale to guide intensity level when exercising independently       Knowledge and understanding of Target Heart Rate Range (THRR) Yes        Intervention Provide education and explanation of THRR including how the numbers were predicted and where they are located for reference       Expected Outcomes Short Term: Able to state/look up THRR;Long Term: Able to use THRR to govern intensity when exercising independently;Short Term: Able to use daily as guideline for intensity in rehab       Able to check pulse independently Yes       Intervention Provide education and demonstration on how to check pulse in carotid and radial arteries.;Review the importance of being able to check your own pulse for safety during independent exercise       Expected Outcomes Short Term: Able to explain why pulse checking is important during independent exercise;Long Term: Able to check pulse independently and accurately       Understanding of Exercise Prescription Yes       Intervention Provide education, explanation, and written materials on patient's individual exercise prescription       Expected Outcomes Short Term: Able to explain program exercise prescription;Long Term: Able to explain home exercise prescription to exercise independently

## 2024-02-18 NOTE — Progress Notes (Signed)
 Cardiac Individual Treatment Plan  Patient Details  Name: Frederick Cole. MRN: [id number] Date of Birth: 02-21-1965 Referring Provider:   Flowsheet Row Cardiac Rehab from 02/18/2024 in [facility] Cardiac and Pulmonary Rehab  Referring Provider Dr. Margery Ruth, MD    Initial Encounter Date:  Flowsheet Row Cardiac Rehab from 02/18/2024 in [facility] Cardiac and Pulmonary Rehab  Date 02/18/24    Visit Diagnosis: Status post coronary artery stent placement  Patient's Home Medications on Admission: Current Medications[1]  Past Medical History: Past Medical History:  Diagnosis Date   Alcohol dependence, daily use (HCC) 05/18/2015   Chronic cough 03/15/2016   De Quervain's tenosynovitis 06/18/2012   Essential hypertension 06/18/2012   GERD without esophagitis 03/15/2016   Marijuana smoker, continuous 03/15/2016   Mixed hyperlipidemia 06/18/2012   Polysubstance abuse (HCC) 06/18/2012   Sciatica 06/18/2012   Traumatic brain injury (HCC) 06/18/2012   Overview:  Has been in hospital with LOC from baseball, football, basketball in his 20s   Uncontrolled type 2 diabetes with neuropathy 11/04/2013    Tobacco Use: Tobacco Use History[2]  Labs: Review Flowsheet        No data to display           Exercise Target Goals: Exercise Program Goal: Individual exercise prescription set using results from initial 6 min walk test and THRR while considering  patients activity barriers and safety.   Exercise Prescription Goal: Initial exercise prescription builds to 30-45 minutes a day of aerobic activity, 2-3 days per week.  Home exercise guidelines will be given to patient during program as part of exercise prescription that the participant will acknowledge.   Education: Aerobic Exercise: - Group verbal and visual presentation on the components of exercise prescription. Introduces F.I.T.T principle from ACSM for exercise prescriptions.  Reviews F.I.T.T. principles of aerobic exercise including  progression. Written material provided at class time.   Education: Resistance Exercise: - Group verbal and visual presentation on the components of exercise prescription. Introduces F.I.T.T principle from ACSM for exercise prescriptions  Reviews F.I.T.T. principles of resistance exercise including progression. Written material provided at class time.    Education: Exercise & Equipment Safety: - Individual verbal instruction and demonstration of equipment use and safety with use of the equipment. Flowsheet Row Cardiac Rehab from 02/18/2024 in [facility] Cardiac and Pulmonary Rehab  Date 02/18/24  Educator NT  Instruction Review Code 1- Verbalizes Understanding    Education: Exercise Physiology & General Exercise Guidelines: - Group verbal and written instruction with models to review the exercise physiology of the cardiovascular system and associated critical values. Provides general exercise guidelines with specific guidelines to those with heart or lung disease. Written material provided at class time.   Education: Flexibility, Balance, Mind/Body Relaxation: - Group verbal and visual presentation with interactive activity on the components of exercise prescription. Introduces F.I.T.T principle from ACSM for exercise prescriptions. Reviews F.I.T.T. principles of flexibility and balance exercise training including progression. Also discusses the mind body connection.  Reviews various relaxation techniques to help reduce and manage stress (i.e. Deep breathing, progressive muscle relaxation, and visualization). Balance handout provided to take home. Written material provided at class time.   Activity Barriers & Risk Stratification:  Activity Barriers & Cardiac Risk Stratification - 02/09/24 0933       Activity Barriers & Cardiac Risk Stratification   Activity Barriers Other (comment)    Comments occasional gout flare up in right foot    Cardiac Risk Stratification Moderate  6 Minute  Walk:  6 Minute Walk     Row Name 02/18/24 1116         6 Minute Walk   Phase Initial     Distance 1355 feet     Walk Time 6 minutes     # of Rest Breaks 0     MPH 2.57     METS 3.48     RPE 7     Perceived Dyspnea  0     VO2 Peak 12.19     Symptoms No     Resting HR 75 bpm     Resting BP 132/74     Resting Oxygen Saturation  97 %     Exercise Oxygen Saturation  during 6 min walk 98 %     Max Ex. HR 102 bpm     Max Ex. BP 144/76     2 Minute Post BP 126/70        Oxygen Initial Assessment:   Oxygen Re-Evaluation:   Oxygen Discharge (Final Oxygen Re-Evaluation):   Initial Exercise Prescription:  Initial Exercise Prescription - 02/18/24 1100       Date of Initial Exercise RX and Referring Provider   Date 02/18/24    Referring Provider Dr. Margery Ruth, MD      Oxygen   Maintain Oxygen Saturation 88% or higher      Treadmill   MPH 2.5    Grade 0    Minutes 15    METs 2.91      NuStep   Level 3    SPM 80    Minutes 15    METs 3.48      REL-XR   Level 3    Speed 50    Minutes 15    METs 3.48      Prescription Details   Frequency (times per week) 2    Duration Progress to 30 minutes of continuous aerobic without signs/symptoms of physical distress      Intensity   THRR 40-80% of Max Heartrate 109-144    Ratings of Perceived Exertion 11-13    Perceived Dyspnea 0-4      Progression   Progression Continue to progress workloads to maintain intensity without signs/symptoms of physical distress.      Resistance Training   Training Prescription Yes    Weight 5 lb    Reps 10-15          Perform Capillary Blood Glucose checks as needed.  Exercise Prescription Changes:   Exercise Prescription Changes     Row Name 02/18/24 1100             Response to Exercise   Blood Pressure (Admit) 132/74       Blood Pressure (Exercise) 144/76       Blood Pressure (Exit) 126/70       Heart Rate (Admit) 75 bpm       Heart Rate (Exercise) 102 bpm        Heart Rate (Exit) 74 bpm       Oxygen Saturation (Admit) 97 %       Oxygen Saturation (Exercise) 98 %       Rating of Perceived Exertion (Exercise) 7       Perceived Dyspnea (Exercise) 0       Symptoms none       Comments Results          Exercise Comments:   Exercise Goals and Review:   Exercise Goals  Row Name 02/18/24 1118             Exercise Goals   Increase Physical Activity Yes       Intervention Provide advice, education, support and counseling about physical activity/exercise needs.;Develop an individualized exercise prescription for aerobic and resistive training based on initial evaluation findings, risk stratification, comorbidities and participant's personal goals.       Expected Outcomes Short Term: Attend rehab on a regular basis to increase amount of physical activity.;Long Term: Add in home exercise to make exercise part of routine and to increase amount of physical activity.;Long Term: Exercising regularly at least 3-5 days a week.       Increase Strength and Stamina Yes       Intervention Provide advice, education, support and counseling about physical activity/exercise needs.;Develop an individualized exercise prescription for aerobic and resistive training based on initial evaluation findings, risk stratification, comorbidities and participant's personal goals.       Expected Outcomes Short Term: Increase workloads from initial exercise prescription for resistance, speed, and METs.;Short Term: Perform resistance training exercises routinely during rehab and add in resistance training at home;Long Term: Improve cardiorespiratory fitness, muscular endurance and strength as measured by increased METs and functional capacity ( )       Able to understand and use rate of perceived exertion (RPE) scale Yes       Intervention Provide education and explanation on how to use RPE scale       Expected Outcomes Short Term: Able to use RPE daily in rehab to  express subjective intensity level;Long Term:  Able to use RPE to guide intensity level when exercising independently       Able to understand and use Dyspnea scale Yes       Intervention Provide education and explanation on how to use Dyspnea scale       Expected Outcomes Short Term: Able to use Dyspnea scale daily in rehab to express subjective sense of shortness of breath during exertion;Long Term: Able to use Dyspnea scale to guide intensity level when exercising independently       Knowledge and understanding of Target Heart Rate Range (THRR) Yes       Intervention Provide education and explanation of THRR including how the numbers were predicted and where they are located for reference       Expected Outcomes Short Term: Able to state/look up THRR;Long Term: Able to use THRR to govern intensity when exercising independently;Short Term: Able to use daily as guideline for intensity in rehab       Able to check pulse independently Yes       Intervention Provide education and demonstration on how to check pulse in carotid and radial arteries.;Review the importance of being able to check your own pulse for safety during independent exercise       Expected Outcomes Short Term: Able to explain why pulse checking is important during independent exercise;Long Term: Able to check pulse independently and accurately       Understanding of Exercise Prescription Yes       Intervention Provide education, explanation, and written materials on patient's individual exercise prescription       Expected Outcomes Short Term: Able to explain program exercise prescription;Long Term: Able to explain home exercise prescription to exercise independently          Exercise Goals Re-Evaluation :   Discharge Exercise Prescription (Final Exercise Prescription Changes):  Exercise Prescription Changes - 02/18/24 1100  Response to Exercise   Blood Pressure (Admit) 132/74    Blood Pressure (Exercise) 144/76     Blood Pressure (Exit) 126/70    Heart Rate (Admit) 75 bpm    Heart Rate (Exercise) 102 bpm    Heart Rate (Exit) 74 bpm    Oxygen Saturation (Admit) 97 %    Oxygen Saturation (Exercise) 98 %    Rating of Perceived Exertion (Exercise) 7    Perceived Dyspnea (Exercise) 0    Symptoms none    Comments Results          Nutrition:  Target Goals: Understanding of nutrition guidelines, daily intake of sodium 1500mg , cholesterol 200mg , calories 30% from fat and 7% or less from saturated fats, daily to have 5 or more servings of fruits and vegetables.  Education: Nutrition 1 -Group instruction provided by verbal, written material, interactive activities, discussions, models, and posters to present general guidelines for heart healthy nutrition including macronutrients, label reading, and promoting whole foods over processed counterparts. Education serves as pensions consultant of discussion of heart healthy eating for all. Written material provided at class time.    Education: Nutrition 2 -Group instruction provided by verbal, written material, interactive activities, discussions, models, and posters to present general guidelines for heart healthy nutrition including sodium, cholesterol, and saturated fat. Providing guidance of habit forming to improve blood pressure, cholesterol, and body weight. Written material provided at class time.     Biometrics:  Pre Biometrics - 02/18/24 1119       Pre Biometrics   Height 5' 8.5 (1.74 m)    Weight 217 lb 6.4 oz (98.6 kg)    Waist Circumference 43 inches    Hip Circumference 41 inches    Waist to Hip Ratio 1.05 %    BMI (Calculated) 32.57    Single Leg Stand 10.03 seconds           Nutrition Therapy Plan and Nutrition Goals:  Nutrition Therapy & Goals - 02/18/24 1127       Intervention Plan   Intervention Prescribe, educate and counsel regarding individualized specific dietary modifications aiming towards targeted core components such  as weight, hypertension, lipid management, diabetes, heart failure and other comorbidities.    Expected Outcomes Short Term Goal: Understand basic principles of dietary content, such as calories, fat, sodium, cholesterol and nutrients.;Short Term Goal: A plan has been developed with personal nutrition goals set during dietitian appointment.;Long Term Goal: Adherence to prescribed nutrition plan.          Nutrition Assessments:  MEDIFICTS Score Key: >=70 Need to make dietary changes  40-70 Heart Healthy Diet <= 40 Therapeutic Level Cholesterol Diet   Picture Your Plate Scores: <59 Unhealthy dietary pattern with much room for improvement. 41-50 Dietary pattern unlikely to meet recommendations for good health and room for improvement. 51-60 More healthful dietary pattern, with some room for improvement.  >60 Healthy dietary pattern, although there may be some specific behaviors that could be improved.    Nutrition Goals Re-Evaluation:   Nutrition Goals Discharge (Final Nutrition Goals Re-Evaluation):   Psychosocial: Target Goals: Acknowledge presence or absence of significant depression and/or stress, maximize coping skills, provide positive support system. Participant is able to verbalize types and ability to use techniques and skills needed for reducing stress and depression.   Education: Stress, Anxiety, and Depression - Group verbal and visual presentation to define topics covered.  Reviews how body is impacted by stress, anxiety, and depression.  Also discusses healthy ways to reduce stress  and to treat/manage anxiety and depression. Written material provided at class time.   Education: Sleep Hygiene -Provides group verbal and written instruction about how sleep can affect your health.  Define sleep hygiene, discuss sleep cycles and impact of sleep habits. Review good sleep hygiene tips.   Initial Review & Psychosocial Screening:  Initial Psych Review & Screening - 02/09/24  0937       Initial Review   Current issues with None Identified      Family Dynamics   Good Support System? Yes   mother     Barriers   Psychosocial barriers to participate in program There are no identifiable barriers or psychosocial needs.;The patient should benefit from training in stress management and relaxation.      Screening Interventions   Interventions Encouraged to exercise;To provide support and resources with identified psychosocial needs;Provide feedback about the scores to participant    Expected Outcomes Short Term goal: Utilizing psychosocial counselor, staff and physician to assist with identification of specific Stressors or current issues interfering with healing process. Setting desired goal for each stressor or current issue identified.;Long Term Goal: Stressors or current issues are controlled or eliminated.;Short Term goal: Identification and review with participant of any Quality of Life or Depression concerns found by scoring the questionnaire.;Long Term goal: The participant improves quality of Life and PHQ9 Scores as seen by post scores and/or verbalization of changes          Quality of Life Scores:   Scores of 19 and below usually indicate a poorer quality of life in these areas.  A difference of  2-3 points is a clinically meaningful difference.  A difference of 2-3 points in the total score of the Quality of Life Index has been associated with significant improvement in overall quality of life, self-image, physical symptoms, and general health in studies assessing change in quality of life.  PHQ-9: Review Flowsheet       02/18/2024  Depression screen PHQ 2/9  Decreased Interest 1  Down, Depressed, Hopeless 1  PHQ - 2 Score 2  Altered sleeping 3  Tired, decreased energy 2  Change in appetite 3  Feeling bad or failure about yourself  1  Trouble concentrating 0  Moving slowly or fidgety/restless 0  Suicidal thoughts 0  PHQ-9 Score 11  Difficult  doing work/chores Not difficult at all   Interpretation of Total Score  Total Score Depression Severity:  1-4 = Minimal depression, 5-9 = Mild depression, 10-14 = Moderate depression, 15-19 = Moderately severe depression, 20-27 = Severe depression   Psychosocial Evaluation and Intervention:  Psychosocial Evaluation - 02/09/24 0938       Psychosocial Evaluation & Interventions   Interventions Stress management education;Relaxation education;Encouraged to exercise with the program and follow exercise prescription    Comments Lorene recently underwent stent placement for CAD. He denies any major negative impact on his activity level, but does want to increase his endurance while in the program. He states he does not have significant sources of stress and reports a good support system, including his mother. Marko enjoys gambling to decompress. He does admit to heavy daily drinking as well as. Reports he used to enjoy playing golf but found this actually increased his stress due to his competitive nature. Nataniel denies any complications from his procedure and is looking forward to starting the program    Expected Outcomes Short: Attend cardiac rehab for education and exercise. Long: Develop and maintain positive self care habits.  Continue Psychosocial Services  Follow up required by staff          Psychosocial Re-Evaluation:   Psychosocial Discharge (Final Psychosocial Re-Evaluation):   Vocational Rehabilitation: Provide vocational rehab assistance to qualifying candidates.   Vocational Rehab Evaluation & Intervention:  Vocational Rehab - 02/09/24 [phone]       Initial Vocational Rehab Evaluation & Intervention   Assessment shows need for Vocational Rehabilitation No          Education: Education Goals: Education classes will be provided on a variety of topics geared toward better understanding of heart health and risk factor modification. Participant will state  understanding/return demonstration of topics presented as noted by education test scores.  Learning Barriers/Preferences:  Learning Barriers/Preferences - 02/09/24 0936       Learning Barriers/Preferences   Learning Barriers None    Learning Preferences None          General Cardiac Education Topics:  AED/CPR: - Group verbal and written instruction with the use of models to demonstrate the basic use of the AED with the basic ABC's of resuscitation.   Test and Procedures: - Group verbal and visual presentation and models provide information about basic cardiac anatomy and function. Reviews the testing methods done to diagnose heart disease and the outcomes of the test results. Describes the treatment choices: Medical Management, Angioplasty, or Coronary Bypass Surgery for treating various heart conditions including Myocardial Infarction, Angina, Valve Disease, and Cardiac Arrhythmias. Written material provided at class time.   Medication Safety: - Group verbal and visual instruction to review commonly prescribed medications for heart and lung disease. Reviews the medication, class of the drug, and side effects. Includes the steps to properly store meds and maintain the prescription regimen. Written material provided at class time.   Intimacy: - Group verbal instruction through game format to discuss how heart and lung disease can affect sexual intimacy. Written material provided at class time.   Know Your Numbers and Heart Failure: - Group verbal and visual instruction to discuss disease risk factors for cardiac and pulmonary disease and treatment options.  Reviews associated critical values for Overweight/Obesity, Hypertension, Cholesterol, and Diabetes.  Discusses basics of heart failure: signs/symptoms and treatments.  Introduces Heart Failure Zone chart for action plan for heart failure. Written material provided at class time.   Infection Prevention: - Provides verbal and  written material to individual with discussion of infection control including proper hand washing and proper equipment cleaning during exercise session. Flowsheet Row Cardiac Rehab from 02/18/2024 in [facility] Cardiac and Pulmonary Rehab  Date 02/18/24  Educator NT  Instruction Review Code 1- Verbalizes Understanding    Falls Prevention: - Provides verbal and written material to individual with discussion of falls prevention and safety. Flowsheet Row Cardiac Rehab from 02/18/2024 in [facility] Cardiac and Pulmonary Rehab  Date 02/18/24  Educator NT  Instruction Review Code 1- Verbalizes Understanding    Other: -Provides group and verbal instruction on various topics (see comments)   Knowledge Questionnaire Score:   Core Components/Risk Factors/Patient Goals at Admission:  Personal Goals and Risk Factors at Admission - 02/09/24 0935       Core Components/Risk Factors/Patient Goals on Admission    Weight Management Yes;Obesity;Weight Loss    Intervention Weight Management: Develop a combined nutrition and exercise program designed to reach desired caloric intake, while maintaining appropriate intake of nutrient and fiber, sodium and fats, and appropriate energy expenditure required for the weight goal.;Weight Management: Provide education and appropriate resources to help participant  work on and attain dietary goals.;Weight Management/Obesity: Establish reasonable short term and long term weight goals.;Obesity: Provide education and appropriate resources to help participant work on and attain dietary goals.    Goal Weight: Long Term 190 lb (86.2 kg)    Expected Outcomes Short Term: Continue to assess and modify interventions until short term weight is achieved;Long Term: Adherence to nutrition and physical activity/exercise program aimed toward attainment of established weight goal;Weight Loss: Understanding of general recommendations for a balanced deficit meal plan, which promotes 1-2 lb weight loss  per week and includes a negative energy balance of [phone] kcal/d;Understanding recommendations for meals to include 15-35% energy as protein, 25-35% energy from fat, 35-60% energy from carbohydrates, less than 200mg  of dietary cholesterol, 20-35 gm of total fiber daily;Understanding of distribution of calorie intake throughout the day with the consumption of 4-5 meals/snacks    Diabetes Yes    Intervention Provide education about signs/symptoms and action to take for hypo/hyperglycemia.;Provide education about proper nutrition, including hydration, and aerobic/resistive exercise prescription along with prescribed medications to achieve blood glucose in normal ranges: Fasting glucose 65-99 mg/dL    Expected Outcomes Short Term: Participant verbalizes understanding of the signs/symptoms and immediate care of hyper/hypoglycemia, proper foot care and importance of medication, aerobic/resistive exercise and nutrition plan for blood glucose control.;Long Term: Attainment of HbA1C < 7%.    Hypertension Yes    Intervention Provide education on lifestyle modifcations including regular physical activity/exercise, weight management, moderate sodium restriction and increased consumption of fresh fruit, vegetables, and low fat dairy, alcohol moderation, and smoking cessation.;Monitor prescription use compliance.    Expected Outcomes Short Term: Continued assessment and intervention until BP is < 140/21mm HG in hypertensive participants. < 130/43mm HG in hypertensive participants with diabetes, heart failure or chronic kidney disease.;Long Term: Maintenance of blood pressure at goal levels.    Lipids Yes    Intervention Provide education and support for participant on nutrition & aerobic/resistive exercise along with prescribed medications to achieve LDL 70mg , HDL >40mg .    Expected Outcomes Short Term: Participant states understanding of desired cholesterol values and is compliant with medications prescribed.  Participant is following exercise prescription and nutrition guidelines.;Long Term: Cholesterol controlled with medications as prescribed, with individualized exercise RX and with personalized nutrition plan. Value goals: LDL < 70mg , HDL > 40 mg.          Education:Diabetes - Individual verbal and written instruction to review signs/symptoms of diabetes, desired ranges of glucose level fasting, after meals and with exercise. Acknowledge that pre and post exercise glucose checks will be done for 3 sessions at entry of program. Flowsheet Row Cardiac Rehab from 02/18/2024 in [facility] Cardiac and Pulmonary Rehab  Date 02/09/24  Educator [facility]  Instruction Review Code 1- Verbalizes Understanding    Core Components/Risk Factors/Patient Goals Review:    Core Components/Risk Factors/Patient Goals at Discharge (Final Review):    ITP Comments:  ITP Comments     Row Name 02/09/24 0920 02/18/24 1114         ITP Comments Initial phone call completed. Diagnosis can be found in [facility] 01/08/2025. EP Orientation scheduled for Wednesday 11/14 at 0800. Completed and gym orientation for cardiac rehab. Initial ITP created and sent for review to Dr. Oneil Pinal, Medical Director.         Comments: Initial ITP    [1]  Current Outpatient Medications:    aspirin EC 81 MG tablet, Take 81 mg by mouth daily., Disp: , Rfl:  atorvastatin (LIPITOR) 80 MG tablet, Take 80 mg by mouth daily., Disp: , Rfl:    clopidogrel (PLAVIX) 75 MG tablet, Take 75 mg by mouth daily., Disp: , Rfl:    colchicine 0.6 MG tablet, Take 0.6 mg by mouth as needed (gout). Take 1.2 mg (2 tablets), followed by 0.6 mg after 1 hour. Day 2 take 0.6 mg (1 tablet) in the morning and 0.6 mg (1 tablet) in the evening until resolved., Disp: , Rfl:    fluticasone (FLOVENT HFA) 110 MCG/ACT inhaler, Inhale into the lungs. (Patient not taking: Reported on 02/09/2024), Disp: , Rfl:    insulin regular (NOVOLIN R,HUMULIN R) 100 units/mL injection, Inject  20 Units into the skin 3 (three) times daily before meals. (Patient taking differently: Inject 22 Units into the skin 3 (three) times daily before meals.), Disp: , Rfl:    losartan (COZAAR) 100 MG tablet, Take 100 mg by mouth daily., Disp: , Rfl:    montelukast (SINGULAIR) 10 MG tablet, Take 10 mg by mouth at bedtime.  (Patient not taking: Reported on 02/09/2024), Disp: , Rfl:    OZEMPIC, 1 MG/DOSE, 4 MG/3ML SOPN, Inject 1 mg into the skin once a week. Inject 1 mg under the skin every 7 days, Disp: , Rfl:    pantoprazole (PROTONIX) 40 MG tablet, Take 1 tablet by mouth daily. (Patient not taking: Reported on 02/09/2024), Disp: , Rfl: 3 [2]  Social History Tobacco Use  Smoking Status Some Days  Smokeless Tobacco Current   Types: Snuff

## 2024-02-19 ENCOUNTER — Encounter: Admitting: Emergency Medicine

## 2024-02-19 ENCOUNTER — Encounter

## 2024-02-19 DIAGNOSIS — Z955 Presence of coronary angioplasty implant and graft: Secondary | ICD-10-CM

## 2024-02-19 DIAGNOSIS — Z48812 Encounter for surgical aftercare following surgery on the circulatory system: Secondary | ICD-10-CM | POA: Diagnosis not present

## 2024-02-19 LAB — GLUCOSE, CAPILLARY
Glucose-Capillary: 220 mg/dL — ABNORMAL HIGH (ref 70–99)
Glucose-Capillary: 158 mg/dL — ABNORMAL HIGH (ref 70–99)

## 2024-02-19 NOTE — Progress Notes (Signed)
 Assessment start time: 8:33 AM  Digestive issues/concerns: no known food allergies   24-hours Recall: B: nothing L: 2-3 PM, chicken caesar wrap OR burger D: fast food  Education r/t nutrition plan Frederick Cole reports he has attended a DM program in the past, knows to limit carbs to a fist size portion and to eat throughout the day. He says he often does do that, eating big meal around 2-3pm and maybe another late in the evening. Usually unhealthy foods like pizza, burgers, wraps, ect. Spoke with him about how he would like to change for the better. Recommended he look to eat 3 times per day say, more veggies and less processed foods. Brainstormed several smaller meals and snacks with foods he likes and will eat focusing on heart healthy eating that will improve his carb controlled portions. He says it may take some time for him to make changes. Will continue to monitor and support as he progresses through the program. Provided mediterranean diet handout. Educated on types of fats, sources and how to read labels. Provided guideline limits of less than 1500mg  sodium and less than 12g saturated fat.   Goal 1: Eat 3 times per day, small frequent meals or nutrient dense snacks  Goal 2: Include more colorful produce, aim for 5-8 servings of fruits and veggies per day Goal 3: Read labels and reduce sodium intake to below 2300mg . Ideally 1500mg  per day.   End time 9:08 AM

## 2024-02-19 NOTE — Progress Notes (Signed)
 Daily Session Note  Patient Details  Name: Frederick Cole. MRN: [id number] Date of Birth: 1965/07/05 Referring Provider:   Flowsheet Row Cardiac Rehab from 02/18/2024 in [facility] Cardiac [facility] Pulmonary Rehab  Referring Provider Dr. Margery Ruth, MD    Encounter Date: 02/19/2024  Check In:  Session Check In - 02/19/24 0736       Check-In   Supervising physician immediately available to respond to emergencies See telemetry face sheet for immediately available ER MD    Location [facility]-Cardiac & Pulmonary Rehab    Staff Present Leita Franks RN,BSN;[facility] RCP,RRT,BSRT;Noah Tickle, [place], Exercise Physiologist;Jason Elnor [facility]    Virtual Visit No    Medication changes reported     No    Fall or balance concerns reported    No    Warm-up and Cool-down Performed on first and last piece of equipment    Resistance Training Performed Yes    VAD Patient? No    PAD/SET Patient? No      Pain Assessment   Currently in Pain? No/denies             Tobacco Use History[1]  Goals Met:  Independence with exercise equipment Exercise tolerated well No report of concerns or symptoms today Strength training completed today  Goals Unmet:  Not Applicable  Comments: First full day of exercise!  Patient was oriented to gym and equipment including functions, settings, policies, and procedures.  Patient's individual exercise prescription and treatment plan were reviewed.  All starting workloads were established based on the results of the 6 minute walk test done at initial orientation visit.  The plan for exercise progression was also introduced and progression will be customized based on patient's performance and goals.    Dr. Oneil Pinal is Medical Director for [facility] Cardiac Rehabilitation.  Dr. Fuad Aleskerov is Medical Director for [facility] Pulmonary Rehabilitation.    [1]  Social History Tobacco Use  Smoking Status Some Days  Smokeless Tobacco Current   Types: Snuff

## 2024-02-24 ENCOUNTER — Encounter

## 2024-02-26 ENCOUNTER — Encounter: Admitting: Emergency Medicine

## 2024-02-26 DIAGNOSIS — Z48812 Encounter for surgical aftercare following surgery on the circulatory system: Secondary | ICD-10-CM | POA: Diagnosis not present

## 2024-02-26 DIAGNOSIS — Z955 Presence of coronary angioplasty implant and graft: Secondary | ICD-10-CM

## 2024-02-26 LAB — GLUCOSE, CAPILLARY
Glucose-Capillary: 130 mg/dL — ABNORMAL HIGH (ref 70–99)
Glucose-Capillary: 127 mg/dL — ABNORMAL HIGH (ref 70–99)

## 2024-02-26 NOTE — Progress Notes (Signed)
 Daily Session Note  Patient Details  Name: Frederick Cole. MRN: [id number] Date of Birth: 1965/04/22 Referring Provider:   Flowsheet Row Cardiac Rehab from 02/18/2024 in [facility] Cardiac and Pulmonary Rehab  Referring Provider Dr. Margery Ruth, MD    Encounter Date: 02/26/2024  Check In:  Session Check In - 02/26/24 0732       Check-In   Supervising physician immediately available to respond to emergencies See telemetry face sheet for immediately available ER MD    Location [facility]-Cardiac & Pulmonary Rehab    Staff Present Leita Franks RN,BSN;Joseph [facility] RCP,RRT,BSRT;Noah Tickle, [place], Exercise Physiologist;Jason Elnor [facility]    Virtual Visit No    Medication changes reported     No    Fall or balance concerns reported    No    Warm-up and Cool-down Performed on first and last piece of equipment    Resistance Training Performed Yes    VAD Patient? No    PAD/SET Patient? No      Pain Assessment   Currently in Pain? No/denies             Tobacco Use History[1]  Goals Met:  Independence with exercise equipment Exercise tolerated well No report of concerns or symptoms today Strength training completed today  Goals Unmet:  Not Applicable  Comments: Pt able to follow exercise prescription today without complaint.  Will continue to monitor for progression.   Dr. Oneil Pinal is Medical Director for [facility] Cardiac Rehabilitation.  Dr. Fuad Aleskerov is Medical Director for [facility] Pulmonary Rehabilitation.    [1]  Social History Tobacco Use  Smoking Status Some Days  Smokeless Tobacco Current   Types: Snuff

## 2024-02-29 ENCOUNTER — Encounter: Payer: Self-pay | Admitting: *Deleted

## 2024-03-02 ENCOUNTER — Encounter

## 2024-03-04 ENCOUNTER — Encounter

## 2024-03-09 ENCOUNTER — Encounter

## 2024-03-11 ENCOUNTER — Encounter: Admitting: Emergency Medicine

## 2024-03-11 DIAGNOSIS — Z955 Presence of coronary angioplasty implant and graft: Secondary | ICD-10-CM

## 2024-03-11 NOTE — Progress Notes (Signed)
 Daily Session Note  Patient Details  Name: Frederick Cole. MRN: [id number] Date of Birth: 05-14-1965 Referring Provider:   Flowsheet Row Cardiac Rehab from 02/18/2024 in [facility] Cardiac and Pulmonary Rehab  Referring Provider Dr. Margery Ruth, MD    Encounter Date: 03/11/2024  Check In:  Session Check In - 03/11/24 0747       Check-In   Supervising physician immediately available to respond to emergencies See telemetry face sheet for immediately available ER MD    Location [facility]-Cardiac & Pulmonary Rehab    Staff Present Leita Franks RN,BSN;Joseph [facility] [place], [place], Exercise Physiologist    Virtual Visit No    Medication changes reported     No    Fall or balance concerns reported    No    Warm-up and Cool-down Performed on first and last piece of equipment    Resistance Training Performed Yes    VAD Patient? No    PAD/SET Patient? No      Pain Assessment   Currently in Pain? No/denies             Tobacco Use History[1]  Goals Met:  Independence with exercise equipment Exercise tolerated well No report of concerns or symptoms today Strength training completed today  Goals Unmet:  Not Applicable  Comments: Pt able to follow exercise prescription today without complaint.  Will continue to monitor for progression.    Dr. Oneil Pinal is Medical Director for [facility] Cardiac Rehabilitation.  Dr. Fuad Aleskerov is Medical Director for [facility] Pulmonary Rehabilitation.    [1]  Social History Tobacco Use  Smoking Status Some Days  Smokeless Tobacco Current   Types: Snuff

## 2024-03-16 ENCOUNTER — Encounter

## 2024-03-18 ENCOUNTER — Encounter

## 2024-03-23 ENCOUNTER — Encounter

## 2024-03-25 ENCOUNTER — Encounter

## 2024-03-30 ENCOUNTER — Encounter

## 2024-04-01 ENCOUNTER — Encounter

## 2024-04-06 ENCOUNTER — Encounter

## 2024-04-08 ENCOUNTER — Encounter

## 2024-04-13 ENCOUNTER — Encounter

## 2024-04-15 ENCOUNTER — Encounter

## 2024-04-20 ENCOUNTER — Encounter

## 2024-04-22 ENCOUNTER — Encounter

## 2024-04-27 ENCOUNTER — Encounter

## 2024-04-29 ENCOUNTER — Encounter

## 2024-05-04 ENCOUNTER — Encounter

## 2024-05-06 ENCOUNTER — Encounter

## 2024-05-11 ENCOUNTER — Encounter

## 2024-05-13 ENCOUNTER — Encounter

## 2024-05-18 ENCOUNTER — Encounter

## 2024-05-20 ENCOUNTER — Encounter

## 2024-05-25 ENCOUNTER — Encounter

## 2024-05-27 ENCOUNTER — Encounter

## 2024-06-01 ENCOUNTER — Encounter

## 2024-06-03 ENCOUNTER — Encounter

## 2024-06-08 ENCOUNTER — Encounter

## 2024-06-10 ENCOUNTER — Encounter

## 2024-06-15 ENCOUNTER — Encounter

## 2024-06-17 ENCOUNTER — Encounter
